# Patient Record
Sex: Female | Born: 1980 | Race: Black or African American | Hispanic: No | Marital: Single
Health system: Southern US, Community
[De-identification: ages and names within clinical notes are randomized; demographics above are authoritative.]

## PROBLEM LIST (undated history)

## (undated) DIAGNOSIS — N926 Irregular menstruation, unspecified: Secondary | ICD-10-CM

## (undated) DIAGNOSIS — E559 Vitamin D deficiency, unspecified: Secondary | ICD-10-CM

## (undated) DIAGNOSIS — Z9851 Tubal ligation status: Secondary | ICD-10-CM

## (undated) DIAGNOSIS — M549 Dorsalgia, unspecified: Secondary | ICD-10-CM

## (undated) DIAGNOSIS — E781 Pure hyperglyceridemia: Secondary | ICD-10-CM

## (undated) DIAGNOSIS — Z8742 Personal history of other diseases of the female genital tract: Secondary | ICD-10-CM

## (undated) DIAGNOSIS — R109 Unspecified abdominal pain: Secondary | ICD-10-CM

## (undated) DIAGNOSIS — R10A2 Flank pain, left side: Secondary | ICD-10-CM

## (undated) DIAGNOSIS — I10 Essential (primary) hypertension: Secondary | ICD-10-CM

## (undated) HISTORY — DX: Irregular menstruation, unspecified: N92.6

## (undated) HISTORY — DX: Essential (primary) hypertension: I10

## (undated) HISTORY — DX: Dorsalgia, unspecified: M54.9

## (undated) HISTORY — DX: Vitamin D deficiency, unspecified: E55.9

## (undated) HISTORY — DX: Personal history of other diseases of the female genital tract: Z87.42

## (undated) HISTORY — DX: Unspecified abdominal pain: R10.9

## (undated) HISTORY — DX: Flank pain, left side: R10.A2

## (undated) HISTORY — DX: Tubal ligation status: Z98.51

## (undated) HISTORY — DX: Pure hyperglyceridemia: E78.1

---

## 2003-03-17 ENCOUNTER — Encounter: Payer: Self-pay | Admitting: Obstetrics and Gynecology

## 2003-03-17 ENCOUNTER — Ambulatory Visit (HOSPITAL_COMMUNITY): Admission: RE | Admit: 2003-03-17 | Discharge: 2003-03-17 | Payer: Self-pay | Admitting: Obstetrics and Gynecology

## 2003-03-19 ENCOUNTER — Inpatient Hospital Stay (HOSPITAL_COMMUNITY): Admission: AD | Admit: 2003-03-19 | Discharge: 2003-03-28 | Payer: Self-pay | Admitting: Family Medicine

## 2003-03-20 ENCOUNTER — Encounter: Payer: Self-pay | Admitting: Family Medicine

## 2004-06-21 ENCOUNTER — Ambulatory Visit (HOSPITAL_COMMUNITY): Admission: RE | Admit: 2004-06-21 | Discharge: 2004-06-21 | Payer: Self-pay | Admitting: *Deleted

## 2004-07-06 ENCOUNTER — Ambulatory Visit (HOSPITAL_COMMUNITY): Admission: RE | Admit: 2004-07-06 | Discharge: 2004-07-06 | Payer: Self-pay | Admitting: *Deleted

## 2004-11-15 ENCOUNTER — Ambulatory Visit (HOSPITAL_COMMUNITY): Admission: RE | Admit: 2004-11-15 | Discharge: 2004-11-15 | Payer: Self-pay | Admitting: *Deleted

## 2004-11-25 ENCOUNTER — Inpatient Hospital Stay (HOSPITAL_COMMUNITY): Admission: AD | Admit: 2004-11-25 | Discharge: 2004-11-25 | Payer: Self-pay | Admitting: *Deleted

## 2004-11-25 ENCOUNTER — Ambulatory Visit: Payer: Self-pay | Admitting: *Deleted

## 2004-11-28 ENCOUNTER — Inpatient Hospital Stay (HOSPITAL_COMMUNITY): Admission: AD | Admit: 2004-11-28 | Discharge: 2004-11-28 | Payer: Self-pay | Admitting: Family Medicine

## 2004-11-28 ENCOUNTER — Ambulatory Visit: Payer: Self-pay | Admitting: Family Medicine

## 2004-12-02 ENCOUNTER — Ambulatory Visit: Payer: Self-pay | Admitting: Obstetrics & Gynecology

## 2004-12-06 ENCOUNTER — Ambulatory Visit: Payer: Self-pay | Admitting: Obstetrics and Gynecology

## 2004-12-06 ENCOUNTER — Ambulatory Visit: Payer: Self-pay | Admitting: Family Medicine

## 2004-12-06 ENCOUNTER — Inpatient Hospital Stay (HOSPITAL_COMMUNITY): Admission: AD | Admit: 2004-12-06 | Discharge: 2004-12-10 | Payer: Self-pay | Admitting: *Deleted

## 2006-07-11 ENCOUNTER — Inpatient Hospital Stay (HOSPITAL_COMMUNITY): Admission: AD | Admit: 2006-07-11 | Discharge: 2006-07-11 | Payer: Self-pay | Admitting: Gynecology

## 2006-08-29 ENCOUNTER — Other Ambulatory Visit: Admission: RE | Admit: 2006-08-29 | Discharge: 2006-08-29 | Payer: Self-pay | Admitting: Obstetrics and Gynecology

## 2019-04-08 ENCOUNTER — Encounter: Payer: Self-pay | Admitting: Family Medicine

## 2019-04-08 ENCOUNTER — Other Ambulatory Visit: Payer: Self-pay

## 2019-04-08 ENCOUNTER — Ambulatory Visit (INDEPENDENT_AMBULATORY_CARE_PROVIDER_SITE_OTHER): Payer: Self-pay | Admitting: Family Medicine

## 2019-04-08 VITALS — BP 136/86 | HR 78 | Temp 97.5°F | Ht 59.0 in | Wt 143.6 lb

## 2019-04-08 DIAGNOSIS — N926 Irregular menstruation, unspecified: Secondary | ICD-10-CM

## 2019-04-08 DIAGNOSIS — Z0000 Encounter for general adult medical examination without abnormal findings: Secondary | ICD-10-CM

## 2019-04-08 DIAGNOSIS — I160 Hypertensive urgency: Secondary | ICD-10-CM

## 2019-04-08 DIAGNOSIS — R0602 Shortness of breath: Secondary | ICD-10-CM

## 2019-04-08 DIAGNOSIS — Z09 Encounter for follow-up examination after completed treatment for conditions other than malignant neoplasm: Secondary | ICD-10-CM

## 2019-04-08 DIAGNOSIS — I10 Essential (primary) hypertension: Secondary | ICD-10-CM | POA: Insufficient documentation

## 2019-04-08 DIAGNOSIS — R10A2 Flank pain, left side: Secondary | ICD-10-CM

## 2019-04-08 DIAGNOSIS — R0789 Other chest pain: Secondary | ICD-10-CM

## 2019-04-08 DIAGNOSIS — R8290 Unspecified abnormal findings in urine: Secondary | ICD-10-CM

## 2019-04-08 DIAGNOSIS — Z7689 Persons encountering health services in other specified circumstances: Secondary | ICD-10-CM

## 2019-04-08 DIAGNOSIS — R109 Unspecified abdominal pain: Secondary | ICD-10-CM

## 2019-04-08 DIAGNOSIS — Z131 Encounter for screening for diabetes mellitus: Secondary | ICD-10-CM

## 2019-04-08 LAB — POCT URINALYSIS DIP (MANUAL ENTRY)
Bilirubin, UA: NEGATIVE
Glucose, UA: NEGATIVE mg/dL
Ketones, POC UA: NEGATIVE mg/dL
Leukocytes, UA: NEGATIVE
Nitrite, UA: NEGATIVE
Protein Ur, POC: NEGATIVE mg/dL
Spec Grav, UA: 1.01 (ref 1.010–1.025)
Urobilinogen, UA: 0.2 E.U./dL
pH, UA: 7 (ref 5.0–8.0)

## 2019-04-08 LAB — POCT GLYCOSYLATED HEMOGLOBIN (HGB A1C): Hemoglobin A1C: 5.3 % (ref 4.0–5.6)

## 2019-04-08 MED ORDER — HYDROCHLOROTHIAZIDE 12.5 MG PO CAPS: 12.5000 mg | ORAL_CAPSULE | Freq: Every day | ORAL | 1 refills | Status: DC

## 2019-04-08 MED ORDER — CLONIDINE HCL 0.1 MG PO TABS
0.2000 mg | ORAL_TABLET | Freq: Once | ORAL | Status: AC
  Administered 2019-04-08: 0.2 mg via ORAL

## 2019-04-08 MED ORDER — CLONIDINE HCL 0.1 MG PO TABS
0.1000 mg | ORAL_TABLET | Freq: Once | ORAL | Status: AC
  Administered 2019-04-08: 0.1 mg via ORAL

## 2019-04-08 MED ORDER — IBUPROFEN 800 MG PO TABS
800.0000 mg | ORAL_TABLET | Freq: Three times a day (TID) | ORAL | 2 refills | Status: AC | PRN
Start: 2019-04-08 — End: ?

## 2019-04-08 MED ORDER — AMLODIPINE BESYLATE 5 MG PO TABS: 5.0000 mg | ORAL_TABLET | Freq: Every day | ORAL | 1 refills | Status: AC

## 2019-04-08 NOTE — Progress Notes (Signed)
Patient [facility]   New Patient--Establish Care  Subjective:  Patient ID: Ellen Reed, female    DOB: Jul 08, 1981  Age: 38 y.o. MRN: [id number]  CC:  Chief Complaint  Patient presents with  . Establish Care  . Menstrual Problem  . Back Pain  . Hypertension   HPI Ellen Reed is a 38 year old female who presents to [facility] today.   Past Medical History:  Diagnosis Date  . Hypertension    Current Status: Patient states that she was taking HCTZ, but has been lost to follow up and states that she had not taken any medications for about 3 years now. She admits to occasional chest discomfort and shortness of breath lately. She denies visual changes, chest pain, cough, heart palpitations, and falls. She has occasional headaches and dizziness with position changes. Denies severe headaches, confusion, seizures, double vision, and blurred vision, nausea and vomiting. She has been having abnormal periods since 02/13/2019, with only spotting. She has been having left flank pain X 2 weeks. She has not had any injuries. She is taking Acetaminophen with minimal relief.   She denies fevers, chills, fatigue, recent infections, weight loss, and night sweats. No reports of GI problems such as diarrhea, and constipation. She has no reports of blood in stools, dysuria and hematuria. No depression or anxiety reported today.  History reviewed. No pertinent surgical history.  Family History  Problem Relation Age of Onset  . Healthy Mother   . Healthy Father   . Hypertension Brother   . Hypertension Brother     Social History   Socioeconomic History  . Marital status: Married    Spouse name: Not on file  . Number of children: Not on file  . Years of education: Not on file  . Highest education level: Not on file  Occupational History  . Not on file  Social Needs  . Financial resource strain: Not on file  . Food insecurity:    Worry:  Not on file    Inability: Not on file  . Transportation needs:    Medical: Not on file    Non-medical: Not on file  Tobacco Use  . Smoking status: Never Smoker  . Smokeless tobacco: Never Used  Substance and Sexual Activity  . Alcohol use: Yes  . Drug use: Never  . Sexual activity: Yes    Partners: Male    Birth control/protection: None  Lifestyle  . Physical activity:    Days per week: Not on file    Minutes per session: Not on file  . Stress: Not on file  Relationships  . Social connections:    Talks on phone: Not on file    Gets together: Not on file    Attends religious service: Not on file    Active member of club or organization: Not on file    Attends meetings of clubs or organizations: Not on file    Relationship status: Not on file  . Intimate partner violence:    Fear of current or ex partner: Not on file    Emotionally abused: Not on file    Physically abused: Not on file    Forced sexual activity: Not on file  Other Topics Concern  . Not on file  Social History Narrative  . Not on file    No outpatient medications prior to visit.   No facility-administered medications prior to visit.     No Known Allergies  ROS Review of Systems  Constitutional: Negative.   HENT: Negative.   Eyes: Negative.   Respiratory: Positive for shortness of breath.   Cardiovascular: Negative.        Chest discomfort  Gastrointestinal: Negative.   Endocrine: Negative.   Genitourinary: Positive for flank pain (left) and menstrual problem (irregular period).  Musculoskeletal: Positive for back pain (left back/flank pain).  Skin: Negative.   Allergic/Immunologic: Negative.   Neurological: Positive for dizziness and headaches.  Hematological: Negative.   Psychiatric/Behavioral: Negative.    Objective:    Physical Exam  Constitutional: She is oriented to person, place, and time. She appears well-developed and well-nourished.  HENT:  Head: Normocephalic and atraumatic.   Eyes: Pupils are equal, round, and reactive to light. Conjunctivae and EOM are normal.  Neck: Normal range of motion. Neck supple.  Cardiovascular: Normal rate, regular rhythm, normal heart sounds and intact distal pulses.  Pulmonary/Chest: Effort normal and breath sounds normal.  Abdominal: Soft. Bowel sounds are normal.  Musculoskeletal: Normal range of motion.  Neurological: She is alert and oriented to person, place, and time. She has normal reflexes.  Skin: Skin is warm and dry.  Psychiatric: She has a normal mood and affect. Her behavior is normal. Judgment and thought content normal.  Nursing note and vitals reviewed.   BP 136/86   Pulse 78   Temp (!) 97.5 F (36.4 C) (Oral)   Ht 4' 11"  (1.499 m)   Wt 143 lb 9.6 oz (65.1 kg)   LMP 02/08/2019   SpO2 99%   BMI 29.00 kg/m  Wt Readings from Last 3 Encounters:  04/08/19 143 lb 9.6 oz (65.1 kg)    Health Maintenance Due  Topic Date Due  . HIV Screening  06/10/1998  . TETANUS/TDAP  06/10/2002  . PAP SMEAR-Modifier  06/10/2004    There are no preventive care reminders to display for this patient.  No results found for: TSH No results found for: WBC, HGB, HCT, MCV, PLT No results found for: NA, K, CHLORIDE, CO2, GLUCOSE, BUN, CREATININE, BILITOT, ALKPHOS, AST, ALT, PROT, ALBUMIN, CALCIUM, ANIONGAP, EGFR, GFR No results found for: CHOL No results found for: HDL No results found for: LDLCALC No results found for: TRIG No results found for: CHOLHDL Lab Results  Component Value Date   HGBA1C 5.3 04/08/2019   Assessment & Plan:   1. Encounter to establish care  2. Hypertensive urgency Blood pressures are elevated today. Clonidine 0.3 mg given to patient in office and blood pressures eventually stabalize. She will report to ED if he eperiences severe headaches, confusion, seizures, double vision, and blurred vision, nausea and vomiting.e denies severe headaches, confusion, seizures, double vision, and blurred vision,  nausea and vomiting. Patient verbalized understanding.   - cloNIDine (CATAPRES) tablet 0.1 mg - cloNIDine (CATAPRES) tablet 0.2 mg  3. Hypertension, unspecified type We will initiate HCTZ and Amlodipine today.  Blood pressure stabalized at 136/86 today. She will continue to decrease high sodium intake, excessive alcohol intake, increase potassium intake, smoking cessation, and increase physical activity of at least 30 minutes of cardio activity daily. She will continue to follow Heart Healthy or DASH diet. - cloNIDine (CATAPRES) tablet 0.1 mg - cloNIDine (CATAPRES) tablet 0.2 mg - amLODipine (NORVASC) 5 MG tablet; Take 1 tablet (5 mg total) by mouth daily.  Dispense: 30 tablet; Refill: 1  4. Chest discomfort ECG results are normal.   5. Shortness of breath Stable.  6. Abnormal menstrual periods We will re-evaluate at next office visit.  7. Acute left flank pain We will initiate Motrin today.  - ibuprofen (ADVIL) 800 MG tablet; Take 1 tablet (800 mg total) by mouth every 8 (eight) hours as needed.  Dispense: 30 tablet; Refill: 2  8. Screening for diabetes mellitus Hgb A1c is within normal range of 5.3 today.  She will continue to decrease foods/beverages high in sugars and carbs and follow Heart Healthy or DASH diet. Increase physical activity to at least 30 minutes cardio exercise daily.  - POCT glycosylated hemoglobin (Hb A1C) - POCT urinalysis dipstick  9. Healthcare maintenance - CBC with Differential - Comprehensive metabolic panel - Lipid Panel - TSH - Vitamin D, 25-hydroxy - Vitamin B12  10. Abnormal urinalysis Results are pending.  - Urine Culture  11. Follow up He will follow up in 1 month.   Meds ordered this encounter  Medications  . cloNIDine (CATAPRES) tablet 0.1 mg  . cloNIDine (CATAPRES) tablet 0.2 mg  . amLODipine (NORVASC) 5 MG tablet    Sig: Take 1 tablet (5 mg total) by mouth daily.    Dispense:  30 tablet    Refill:  1  . hydrochlorothiazide  (MICROZIDE) 12.5 MG capsule    Sig: Take 1 capsule (12.5 mg total) by mouth daily.    Dispense:  30 capsule    Refill:  1  . ibuprofen (ADVIL) 800 MG tablet    Sig: Take 1 tablet (800 mg total) by mouth every 8 (eight) hours as needed.    Dispense:  30 tablet    Refill:  2    Orders Placed This Encounter  Procedures  . Urine Culture  . CBC with Differential  . Comprehensive metabolic panel  . Lipid Panel  . TSH  . Vitamin D, 25-hydroxy  . Vitamin B12  . POCT glycosylated hemoglobin (Hb A1C)  . POCT urinalysis dipstick    Referral Orders  No referral(s) requested today    Kathe Becton,  MSN, FNP-C Patient [address] [address], [place] [id number] [phone]   Problem List Items Addressed This Visit    None    Visit Diagnoses    Encounter to establish care    -  Primary   Hypertensive urgency       Relevant Medications   cloNIDine (CATAPRES) tablet 0.1 mg (Completed)   cloNIDine (CATAPRES) tablet 0.2 mg (Completed)   amLODipine (NORVASC) 5 MG tablet   hydrochlorothiazide (MICROZIDE) 12.5 MG capsule   Hypertension, unspecified type       Relevant Medications   cloNIDine (CATAPRES) tablet 0.1 mg (Completed)   cloNIDine (CATAPRES) tablet 0.2 mg (Completed)   amLODipine (NORVASC) 5 MG tablet   hydrochlorothiazide (MICROZIDE) 12.5 MG capsule   Chest discomfort       Shortness of breath       Abnormal menstrual periods       Acute left flank pain       Relevant Medications   ibuprofen (ADVIL) 800 MG tablet   Screening for diabetes mellitus       Relevant Orders   POCT glycosylated hemoglobin (Hb A1C) (Completed)   POCT urinalysis dipstick (Completed)   Healthcare maintenance       Relevant Orders   CBC with Differential   Comprehensive metabolic panel   Lipid Panel   TSH   Vitamin D, 25-hydroxy   Vitamin B12   Abnormal urinalysis       Relevant Orders   Urine Culture   Follow up  Meds ordered this  encounter  Medications  . cloNIDine (CATAPRES) tablet 0.1 mg  . cloNIDine (CATAPRES) tablet 0.2 mg  . amLODipine (NORVASC) 5 MG tablet    Sig: Take 1 tablet (5 mg total) by mouth daily.    Dispense:  30 tablet    Refill:  1  . hydrochlorothiazide (MICROZIDE) 12.5 MG capsule    Sig: Take 1 capsule (12.5 mg total) by mouth daily.    Dispense:  30 capsule    Refill:  1  . ibuprofen (ADVIL) 800 MG tablet    Sig: Take 1 tablet (800 mg total) by mouth every 8 (eight) hours as needed.    Dispense:  30 tablet    Refill:  2    Follow-up: Return in about 1 month (around 05/09/2019).    Azzie Glatter, FNP

## 2019-04-08 NOTE — Patient Instructions (Addendum)
Amlodipine tablets What is this medicine? AMLODIPINE (am LOE di peen) is a calcium-channel blocker. It affects the amount of calcium found in your heart and muscle cells. This relaxes your blood vessels, which can reduce the amount of work the heart has to do. This medicine is used to lower high blood pressure. It is also used to prevent chest pain. This medicine may be used for other purposes; ask your health care provider or pharmacist if you have questions. COMMON BRAND NAME(S): Norvasc What should I tell my health care provider before I take this medicine? They need to know if you have any of these conditions: -heart disease -liver disease -an unusual or allergic reaction to amlodipine, other medicines, foods, dyes, or preservatives -pregnant or trying to get pregnant -breast-feeding How should I use this medicine? Take this medicine by mouth with a glass of water. Follow the directions on the prescription label. You can take it with or without food. If it upsets your stomach, take it with food. Take your medicine at regular intervals. Do not take it more often than directed. Do not stop taking except on your doctor's advice. Talk to your pediatrician regarding the use of this medicine in children. While this drug may be prescribed for children as young as 6 years for selected conditions, precautions do apply. Patients over 80 years of age may have a stronger reaction and need a smaller dose. Overdosage: If you think you have taken too much of this medicine contact a poison control center or emergency room at once. NOTE: This medicine is only for you. Do not share this medicine with others. What if I miss a dose? If you miss a dose, take it as soon as you can. If it is almost time for your next dose, take only that dose. Do not take double or extra doses. What may interact with this medicine? Do not take this medicine with any of the following medications: -tranylcypromine This medicine  may also interact with the following medications: -clarithromycin -cyclosporine -diltiazem -itraconazole -simvastatin -tacrolimus This list may not describe all possible interactions. Give your health care provider a list of all the medicines, herbs, non-prescription drugs, or dietary supplements you use. Also tell them if you smoke, drink alcohol, or use illegal drugs. Some items may interact with your medicine. What should I watch for while using this medicine? Visit your healthcare professional for regular checks on your progress. Check your blood pressure as directed. Ask your healthcare professional what your blood pressure should be and when you should contact him or her. Do not treat yourself for coughs, colds, or pain while you are using this medicine without asking your healthcare professional for advice. Some medicines may increase your blood pressure. You may get dizzy. Do not drive, use machinery, or do anything that needs mental alertness until you know how this medicine affects you. Do not stand or sit up quickly, especially if you are an older patient. This reduces the risk of dizzy or fainting spells. Avoid alcoholic drinks; they can make you dizzier. What side effects may I notice from receiving this medicine? Side effects that you should report to your doctor or health care professional as soon as possible: -allergic reactions like skin rash, itching or hives; swelling of the face, lips, or tongue -fast, irregular heartbeat -signs and symptoms of low blood pressure like dizziness; feeling faint or lightheaded, falls; unusually weak or tired -swelling of ankles, feet, hands Side effects that usually do not require medical  attention (report these to your doctor or health care professional if they continue or are bothersome): -dry mouth -facial flushing -headache -stomach pain -tiredness This list may not describe all possible side effects. Call your doctor for medical advice  about side effects. You may report side effects to FDA at 1-800-FDA-1088. Where should I keep my medicine? Keep out of the reach of children. Store at room temperature between 59 and 86 degrees F (15 and 30 degrees C). Throw away any unused medicine after the expiration date. NOTE: This sheet is a summary. It may not cover all possible information. If you have questions about this medicine, talk to your doctor, pharmacist, or health care provider.  2019 Elsevier/Gold Standard (2018-06-14 15:07:10) Hydrochlorothiazide, HCTZ capsules or tablets What is this medicine? HYDROCHLOROTHIAZIDE (hye droe klor oh THYE a zide) is a diuretic. It increases the amount of urine passed, which causes the body to lose salt and water. This medicine is used to treat high blood pressure. It is also reduces the swelling and water retention caused by various medical conditions, such as heart, liver, or kidney disease. This medicine may be used for other purposes; ask your health care provider or pharmacist if you have questions. COMMON BRAND NAME(S): Esidrix, Ezide, HydroDIURIL, Microzide, Oretic, Zide What should I tell my health care provider before I take this medicine? They need to know if you have any of these conditions: -diabetes -gout -immune system problems, like lupus -kidney disease or kidney stones -liver disease -pancreatitis -small amount of urine or difficulty passing urine -an unusual or allergic reaction to hydrochlorothiazide, sulfa drugs, other medicines, foods, dyes, or preservatives -pregnant or trying to get pregnant -breast-feeding How should I use this medicine? Take this medicine by mouth with a glass of water. Follow the directions on the prescription label. Take your medicine at regular intervals. Remember that you will need to pass urine frequently after taking this medicine. Do not take your doses at a time of day that will cause you problems. Do not stop taking your medicine unless  your doctor tells you to. Talk to your pediatrician regarding the use of this medicine in children. Special care may be needed. Overdosage: If you think you have taken too much of this medicine contact a poison control center or emergency room at once. NOTE: This medicine is only for you. Do not share this medicine with others. What if I miss a dose? If you miss a dose, take it as soon as you can. If it is almost time for your next dose, take only that dose. Do not take double or extra doses. What may interact with this medicine? -cholestyramine -colestipol -digoxin -dofetilide -lithium -medicines for blood pressure -medicines for diabetes -medicines that relax muscles for surgery -other diuretics -steroid medicines like prednisone or cortisone This list may not describe all possible interactions. Give your health care provider a list of all the medicines, herbs, non-prescription drugs, or dietary supplements you use. Also tell them if you smoke, drink alcohol, or use illegal drugs. Some items may interact with your medicine. What should I watch for while using this medicine? Visit your doctor or health care professional for regular checks on your progress. Check your blood pressure as directed. Ask your doctor or health care professional what your blood pressure should be and when you should contact him or her. You may need to be on a special diet while taking this medicine. Ask your doctor. Check with your doctor or health care professional if  you get an attack of severe diarrhea, nausea and vomiting, or if you sweat a lot. The loss of too much body fluid can make it dangerous for you to take this medicine. You may get drowsy or dizzy. Do not drive, use machinery, or do anything that needs mental alertness until you know how this medicine affects you. Do not stand or sit up quickly, especially if you are an older patient. This reduces the risk of dizzy or fainting spells. Alcohol may  interfere with the effect of this medicine. Avoid alcoholic drinks. This medicine may affect your blood sugar level. If you have diabetes, check with your doctor or health care professional before changing the dose of your diabetic medicine. This medicine can make you more sensitive to the sun. Keep out of the sun. If you cannot avoid being in the sun, wear protective clothing and use sunscreen. Do not use sun lamps or tanning beds/booths. What side effects may I notice from receiving this medicine? Side effects that you should report to your doctor or health care professional as soon as possible: -allergic reactions such as skin rash or itching, hives, swelling of the lips, mouth, tongue, or throat -changes in vision -chest pain -eye pain -fast or irregular heartbeat -feeling faint or lightheaded, falls -gout attack -muscle pain or cramps -pain or difficulty when passing urine -pain, tingling, numbness in the hands or feet -redness, blistering, peeling or loosening of the skin, including inside the mouth -unusually weak or tired Side effects that usually do not require medical attention (report to your doctor or health care professional if they continue or are bothersome): -change in sex drive or performance -dry mouth -headache -stomach upset This list may not describe all possible side effects. Call your doctor for medical advice about side effects. You may report side effects to FDA at 1-800-FDA-1088. Where should I keep my medicine? Keep out of the reach of children. Store at room temperature between 15 and 30 degrees C (59 and 86 degrees F). Do not freeze. Protect from light and moisture. Keep container closed tightly. Throw away any unused medicine after the expiration date. NOTE: This sheet is a summary. It may not cover all possible information. If you have questions about this medicine, talk to your doctor, pharmacist, or health care provider.  2019 Elsevier/Gold Standard  (2010-07-15 12:57:37) Hypertension Hypertension, commonly called high blood pressure, is when the force of blood pumping through the arteries is too strong. The arteries are the blood vessels that carry blood from the heart throughout the body. Hypertension forces the heart to work harder to pump blood and may cause arteries to become narrow or stiff. Having untreated or uncontrolled hypertension can cause heart attacks, strokes, kidney disease, and other problems. A blood pressure reading consists of a higher number over a lower number. Ideally, your blood pressure should be below 120/80. The first ("top") number is called the systolic pressure. It is a measure of the pressure in your arteries as your heart beats. The second ("bottom") number is called the diastolic pressure. It is a measure of the pressure in your arteries as the heart relaxes. What are the causes? The cause of this condition is not known. What increases the risk? Some risk factors for high blood pressure are under your control. Others are not. Factors you can change  Smoking.  Having type 2 diabetes mellitus, high cholesterol, or both.  Not getting enough exercise or physical activity.  Being overweight.  Having too much fat,  sugar, calories, or salt (sodium) in your diet.  Drinking too much alcohol. Factors that are difficult or impossible to change  Having chronic kidney disease.  Having a family history of high blood pressure.  Age. Risk increases with age.  Race. You may be at higher risk if you are African-American.  Gender. Men are at higher risk than women before age 10. After age 48, women are at higher risk than men.  Having obstructive sleep apnea.  Stress. What are the signs or symptoms? Extremely high blood pressure (hypertensive crisis) may cause:  Headache.  Anxiety.  Shortness of breath.  Nosebleed.  Nausea and vomiting.  Severe chest pain.  Jerky movements you cannot control  (seizures). How is this diagnosed? This condition is diagnosed by measuring your blood pressure while you are seated, with your arm resting on a surface. The cuff of the blood pressure monitor will be placed directly against the skin of your upper arm at the level of your heart. It should be measured at least twice using the same arm. Certain conditions can cause a difference in blood pressure between your right and left arms. Certain factors can cause blood pressure readings to be lower or higher than normal (elevated) for a short period of time:  When your blood pressure is higher when you are in a health care provider's office than when you are at home, this is called white coat hypertension. Most people with this condition do not need medicines.  When your blood pressure is higher at home than when you are in a health care provider's office, this is called masked hypertension. Most people with this condition may need medicines to control blood pressure. If you have a high blood pressure reading during one visit or you have normal blood pressure with other risk factors:  You may be asked to return on a different day to have your blood pressure checked again.  You may be asked to monitor your blood pressure at home for 1 week or longer. If you are diagnosed with hypertension, you may have other blood or imaging tests to help your health care provider understand your overall risk for other conditions. How is this treated? This condition is treated by making healthy lifestyle changes, such as eating healthy foods, exercising more, and reducing your alcohol intake. Your health care provider may prescribe medicine if lifestyle changes are not enough to get your blood pressure under control, and if:  Your systolic blood pressure is above 130.  Your diastolic blood pressure is above 80. Your personal target blood pressure may vary depending on your medical conditions, your age, and other  factors. Follow these instructions at home: Eating and drinking   Eat a diet that is high in fiber and potassium, and low in sodium, added sugar, and fat. An example eating plan is called the DASH (Dietary Approaches to Stop Hypertension) diet. To eat this way: ? Eat plenty of fresh fruits and vegetables. Try to fill half of your plate at each meal with fruits and vegetables. ? Eat whole grains, such as whole wheat pasta, brown rice, or whole grain bread. Fill about one quarter of your plate with whole grains. ? Eat or drink low-fat dairy products, such as skim milk or low-fat yogurt. ? Avoid fatty cuts of meat, processed or cured meats, and poultry with skin. Fill about one quarter of your plate with lean proteins, such as fish, chicken without skin, beans, eggs, and tofu. ? Avoid premade and processed  foods. These tend to be higher in sodium, added sugar, and fat.  Reduce your daily sodium intake. Most people with hypertension should eat less than 1,500 mg of sodium a day.  Limit alcohol intake to no more than 1 drink a day for nonpregnant women and 2 drinks a day for men. One drink equals 12 oz of beer, 5 oz of wine, or 1 oz of hard liquor. Lifestyle   Work with your health care provider to maintain a healthy body weight or to lose weight. Ask what an ideal weight is for you.  Get at least 30 minutes of exercise that causes your heart to beat faster (aerobic exercise) most days of the week. Activities may include walking, swimming, or biking.  Include exercise to strengthen your muscles (resistance exercise), such as pilates or lifting weights, as part of your weekly exercise routine. Try to do these types of exercises for 30 minutes at least 3 days a week.  Do not use any products that contain nicotine or tobacco, such as cigarettes and e-cigarettes. If you need help quitting, ask your health care provider.  Monitor your blood pressure at home as told by your health care  provider.  Keep all follow-up visits as told by your health care provider. This is important. Medicines  Take over-the-counter and prescription medicines only as told by your health care provider. Follow directions carefully. Blood pressure medicines must be taken as prescribed.  Do not skip doses of blood pressure medicine. Doing this puts you at risk for problems and can make the medicine less effective.  Ask your health care provider about side effects or reactions to medicines that you should watch for. Contact a health care provider if:  You think you are having a reaction to a medicine you are taking.  You have headaches that keep coming back (recurring).  You feel dizzy.  You have swelling in your ankles.  You have trouble with your vision. Get help right away if:  You develop a severe headache or confusion.  You have unusual weakness or numbness.  You feel faint.  You have severe pain in your chest or abdomen.  You vomit repeatedly.  You have trouble breathing. Summary  Hypertension is when the force of blood pumping through your arteries is too strong. If this condition is not controlled, it may put you at risk for serious complications.  Your personal target blood pressure may vary depending on your medical conditions, your age, and other factors. For most people, a normal blood pressure is less than 120/80.  Hypertension is treated with lifestyle changes, medicines, or a combination of both. Lifestyle changes include weight loss, eating a healthy, low-sodium diet, exercising more, and limiting alcohol. This information is not intended to replace advice given to you by your health care provider. Make sure you discuss any questions you have with your health care provider. Document Released: 11/20/2005 Document Revised: 10/18/2016 Document Reviewed: 10/18/2016 Elsevier Interactive Patient Education  2019 [employer].

## 2019-04-09 ENCOUNTER — Encounter: Payer: Self-pay | Admitting: Family Medicine

## 2019-04-09 ENCOUNTER — Other Ambulatory Visit: Payer: Self-pay | Admitting: Family Medicine

## 2019-04-09 DIAGNOSIS — E559 Vitamin D deficiency, unspecified: Secondary | ICD-10-CM

## 2019-04-09 DIAGNOSIS — E781 Pure hyperglyceridemia: Secondary | ICD-10-CM

## 2019-04-09 LAB — VITAMIN D 25 HYDROXY (VIT D DEFICIENCY, FRACTURES): Vit D, 25-Hydroxy: 14.1 ng/mL — ABNORMAL LOW (ref 30.0–100.0)

## 2019-04-09 LAB — LIPID PANEL
Chol/HDL Ratio: 3.6 ratio (ref 0.0–4.4)
Cholesterol, Total: 191 mg/dL (ref 100–199)
HDL: 53 mg/dL (ref >39)
LDL Calculated: 67 mg/dL (ref 0–99)
Triglycerides: 356 mg/dL — ABNORMAL HIGH (ref 0–149)
VLDL Cholesterol Cal: 71 mg/dL — ABNORMAL HIGH (ref 5–40)

## 2019-04-09 LAB — CBC WITH DIFFERENTIAL/PLATELET
Basophils Absolute: 0 10*3/uL (ref 0.0–0.2)
Basos: 0 %
EOS (ABSOLUTE): 0.1 10*3/uL (ref 0.0–0.4)
Eos: 2 %
Hematocrit: 37.4 % (ref 34.0–46.6)
Hemoglobin: 13.3 g/dL (ref 11.1–15.9)
Immature Grans (Abs): 0 10*3/uL (ref 0.0–0.1)
Immature Granulocytes: 0 %
Lymphocytes Absolute: 1.7 10*3/uL (ref 0.7–3.1)
Lymphs: 20 %
MCH: 32.2 pg (ref 26.6–33.0)
MCHC: 35.6 g/dL (ref 31.5–35.7)
MCV: 91 fL (ref 79–97)
Monocytes Absolute: 0.4 10*3/uL (ref 0.1–0.9)
Monocytes: 5 %
Neutrophils Absolute: 6 10*3/uL (ref 1.4–7.0)
Neutrophils: 73 %
Platelets: 254 10*3/uL (ref 150–450)
RBC: 4.13 x10E6/uL (ref 3.77–5.28)
RDW: 14 % (ref 11.7–15.4)
WBC: 8.3 10*3/uL (ref 3.4–10.8)

## 2019-04-09 LAB — COMPREHENSIVE METABOLIC PANEL
ALT: 22 IU/L (ref 0–32)
AST: 17 IU/L (ref 0–40)
Albumin/Globulin Ratio: 1.7 (ref 1.2–2.2)
Albumin: 4.3 g/dL (ref 3.8–4.8)
Alkaline Phosphatase: 81 IU/L (ref 39–117)
BUN/Creatinine Ratio: 13 (ref 9–23)
BUN: 7 mg/dL (ref 6–20)
Bilirubin Total: <0.2 mg/dL (ref 0.0–1.2)
CO2: 20 mmol/L (ref 20–29)
Calcium: 9.4 mg/dL (ref 8.7–10.2)
Chloride: 102 mmol/L (ref 96–106)
Creatinine, Ser: 0.52 mg/dL — ABNORMAL LOW (ref 0.57–1.00)
GFR calc Af Amer: 143 mL/min/{1.73_m2} (ref >59)
GFR calc non Af Amer: 124 mL/min/{1.73_m2} (ref >59)
Globulin, Total: 2.6 g/dL (ref 1.5–4.5)
Glucose: 91 mg/dL (ref 65–99)
Potassium: 4 mmol/L (ref 3.5–5.2)
Sodium: 137 mmol/L (ref 134–144)
Total Protein: 6.9 g/dL (ref 6.0–8.5)

## 2019-04-09 LAB — VITAMIN B12: Vitamin B-12: 434 pg/mL (ref 232–1245)

## 2019-04-09 LAB — TSH: TSH: 2.44 u[IU]/mL (ref 0.450–4.500)

## 2019-04-09 MED ORDER — ATORVASTATIN CALCIUM 10 MG PO TABS: 10.0000 mg | ORAL_TABLET | Freq: Every day | ORAL | 3 refills | Status: AC

## 2019-04-09 MED ORDER — VITAMIN D (ERGOCALCIFEROL) 1.25 MG (50000 UNIT) PO CAPS: 50000.0000 [IU] | ORAL_CAPSULE | ORAL | 3 refills | Status: AC

## 2019-04-10 LAB — URINE CULTURE

## 2019-04-17 ENCOUNTER — Other Ambulatory Visit: Payer: Self-pay | Admitting: Family Medicine

## 2019-04-17 DIAGNOSIS — A498 Other bacterial infections of unspecified site: Secondary | ICD-10-CM

## 2019-04-17 DIAGNOSIS — B952 Enterococcus as the cause of diseases classified elsewhere: Secondary | ICD-10-CM

## 2019-04-17 MED ORDER — CIPROFLOXACIN HCL 500 MG PO TABS: 500.0000 mg | ORAL_TABLET | Freq: Two times a day (BID) | ORAL | 0 refills | Status: AC

## 2019-04-23 ENCOUNTER — Telehealth: Payer: Self-pay

## 2019-04-23 NOTE — Telephone Encounter (Signed)
Patient notified

## 2019-05-08 ENCOUNTER — Telehealth: Payer: Self-pay

## 2019-05-08 NOTE — Telephone Encounter (Signed)
Left a vm for patient to callback to do screening before appointment

## 2019-05-09 ENCOUNTER — Other Ambulatory Visit: Payer: Self-pay

## 2019-05-09 ENCOUNTER — Encounter: Payer: Self-pay | Admitting: Family Medicine

## 2019-05-09 ENCOUNTER — Ambulatory Visit (INDEPENDENT_AMBULATORY_CARE_PROVIDER_SITE_OTHER): Payer: Medicaid Other | Admitting: Family Medicine

## 2019-05-09 VITALS — BP 120/78 | HR 69 | Temp 98.4°F | Resp 16 | Wt 142.0 lb

## 2019-05-09 DIAGNOSIS — Z8742 Personal history of other diseases of the female genital tract: Secondary | ICD-10-CM

## 2019-05-09 DIAGNOSIS — Z124 Encounter for screening for malignant neoplasm of cervix: Secondary | ICD-10-CM

## 2019-05-09 DIAGNOSIS — Z0000 Encounter for general adult medical examination without abnormal findings: Secondary | ICD-10-CM | POA: Diagnosis not present

## 2019-05-09 DIAGNOSIS — Z09 Encounter for follow-up examination after completed treatment for conditions other than malignant neoplasm: Secondary | ICD-10-CM

## 2019-05-09 DIAGNOSIS — Z01419 Encounter for gynecological examination (general) (routine) without abnormal findings: Secondary | ICD-10-CM

## 2019-05-09 NOTE — Progress Notes (Signed)
[facility]  Pap Smear  Subjective:  Patient ID: Ellen Reed, female    DOB: 1981-08-10  Age: 38 y.o. MRN: [id number]  CC:  Chief Complaint  Patient presents with  . Gynecologic Exam  . Menstrual Problem    spotting inbetween periods     HPI Ellen Reed is a 38 year old female who presents for Pap Smear today.   Past Medical History:  Diagnosis Date  . Abnormal menstrual periods   . Back pain   . H/O tubal ligation   . Hypertension   . Hypertriglyceridemia   . Left flank pain   . Vitamin D deficiency    Current Status: Patient also here for routine gynecological exam. She is not had a Pap smear in greater than 2 years ago.  Patient states that she has always had abnormal Pap smears and is will need a Pap every year. She has 4 children. She is sexually active. She reports occasional spotting X 3 months in between periods. She denies any other abnormal vaginal discharge, vaginal  itching, vaginal burning, or dyspareunia. Patient states that she does not perform monthly self breast exams. She has an upcoming mammogram. She typically follows a balanced diet but does not exercise routinely. Body mass index is 28.68.Tubal ligation 5 years ago. Her last menstrual period was 04/12/2019. Her periods usually last about 5 days. No discharge, discomfort, irregular bleeding, abdominal pain. She is s/p: hysterectomy X 5 years ago. She denies recent infections, fevers, chills, weight loss, and night sweats. Denies cough, heart palpitations, chest pain, and shortness of breath. Denies any GI symptoms. She denies pain today.  History reviewed. No pertinent surgical history.  Family History  Problem Relation Age of Onset  . Healthy Mother   . Healthy Father   . Hypertension Brother   . Hypertension Brother     Social History   Socioeconomic History  . Marital status: Married    Spouse name: Not on file  . Number of children: Not  on file  . Years of education: Not on file  . Highest education level: Not on file  Occupational History  . Not on file  Social Needs  . Financial resource strain: Not on file  . Food insecurity:    Worry: Not on file    Inability: Not on file  . Transportation needs:    Medical: Not on file    Non-medical: Not on file  Tobacco Use  . Smoking status: Never Smoker  . Smokeless tobacco: Never Used  Substance and Sexual Activity  . Alcohol use: Yes  . Drug use: Never  . Sexual activity: Yes    Partners: Male    Birth control/protection: None  Lifestyle  . Physical activity:    Days per week: Not on file    Minutes per session: Not on file  . Stress: Not on file  Relationships  . Social connections:    Talks on phone: Not on file    Gets together: Not on file    Attends religious service: Not on file    Active member of club or organization: Not on file    Attends meetings of clubs or organizations: Not on file    Relationship status: Not on file  . Intimate partner violence:    Fear of current or ex partner: Not on file    Emotionally abused: Not on file    Physically abused: Not on file  Forced sexual activity: Not on file  Other Topics Concern  . Not on file  Social History Narrative  . Not on file    Outpatient Medications Prior to Visit  Medication Sig Dispense Refill  . amLODipine (NORVASC) 5 MG tablet Take 1 tablet (5 mg total) by mouth daily. 30 tablet 1  . atorvastatin (LIPITOR) 10 MG tablet Take 1 tablet (10 mg total) by mouth daily. 30 tablet 3  . hydrochlorothiazide (MICROZIDE) 12.5 MG capsule Take 1 capsule (12.5 mg total) by mouth daily. 30 capsule 1  . ibuprofen (ADVIL) 800 MG tablet Take 1 tablet (800 mg total) by mouth every 8 (eight) hours as needed. 30 tablet 2  . Vitamin D, Ergocalciferol, (DRISDOL) 1.25 MG (50000 UT) CAPS capsule Take 1 capsule (50,000 Units total) by mouth every 7 (seven) days. 5 capsule 3   No facility-administered  medications prior to visit.     No Known Allergies  ROS Review of Systems  Constitutional: Negative.   HENT: Negative.   Eyes: Negative.   Respiratory: Negative.   Cardiovascular: Negative.   Gastrointestinal: Negative.   Endocrine: Negative.   Genitourinary: Positive for menstrual problem (occasional spotting between menstrual periods ).  Musculoskeletal: Negative.   Skin: Negative.   Allergic/Immunologic: Negative.   Neurological: Negative.   Hematological: Negative.   Psychiatric/Behavioral: Negative.    Objective:    Physical Exam  Constitutional: She is oriented to person, place, and time. She appears well-developed and well-nourished.  HENT:  Head: Normocephalic and atraumatic.  Eyes: Conjunctivae are normal.  Neck: Normal range of motion. Neck supple.  Cardiovascular: Normal rate, regular rhythm, normal heart sounds and intact distal pulses.  Pulmonary/Chest: Effort normal and breath sounds normal.  Abdominal: Soft. Bowel sounds are normal.  Musculoskeletal: Normal range of motion.  Neurological: She is alert and oriented to person, place, and time. She has normal reflexes.  Skin: Skin is warm and dry.  Psychiatric: She has a normal mood and affect. Her behavior is normal. Judgment and thought content normal.  Nursing note and vitals reviewed.   BP 120/78 (BP Location: Left Arm, Patient Position: Sitting, Cuff Size: Normal)   Pulse 69   Temp 98.4 F (36.9 C) (Oral)   Resp 16   Wt 142 lb (64.4 kg)   LMP 04/12/2019   SpO2 100%   BMI 28.68 kg/m  Wt Readings from Last 3 Encounters:  05/09/19 142 lb (64.4 kg)  04/08/19 143 lb 9.6 oz (65.1 kg)     Health Maintenance Due  Topic Date Due  . HIV Screening  06/10/1998  . TETANUS/TDAP  06/10/2002  . PAP SMEAR-Modifier  06/10/2004    There are no preventive care reminders to display for this patient.  Lab Results  Component Value Date   TSH 2.440 04/08/2019   Lab Results  Component Value Date   WBC  8.3 04/08/2019   HGB 13.3 04/08/2019   HCT 37.4 04/08/2019   MCV 91 04/08/2019   PLT 254 04/08/2019   Lab Results  Component Value Date   NA 137 04/08/2019   K 4.0 04/08/2019   CO2 20 04/08/2019   GLUCOSE 91 04/08/2019   BUN 7 04/08/2019   CREATININE 0.52 (L) 04/08/2019   BILITOT <0.2 04/08/2019   ALKPHOS 81 04/08/2019   AST 17 04/08/2019   ALT 22 04/08/2019   PROT 6.9 04/08/2019   ALBUMIN 4.3 04/08/2019   CALCIUM 9.4 04/08/2019   Lab Results  Component Value Date   CHOL 191 04/08/2019  Lab Results  Component Value Date   HDL 53 04/08/2019   Lab Results  Component Value Date   LDLCALC 67 04/08/2019   Lab Results  Component Value Date   TRIG 356 (H) 04/08/2019   Lab Results  Component Value Date   CHOLHDL 3.6 04/08/2019   Lab Results  Component Value Date   HGBA1C 5.3 04/08/2019    Assessment & Plan:   1. Encounter for cervical Pap smear with pelvic exam Pap test, as part of routine gynecological examination Continue monthly self breast exam Recommend daily multivitamin for women Recommend strength training in 150 minutes of cardiovascular exercise per week  2. Pap smear for cervical cancer screening - Pap IG, CT/NG w/ reflex HPV when [facility] [employer]  3. History of abnormal Pap Smear  4. Follow up She will follow up in 6 months.   No orders of the defined types were placed in this encounter.   No orders of the defined types were placed in this encounter.   Referral Orders  No referral(s) requested today    Raliegh IpNatalie Callyn Severtson,  MSN, FNP-BC [facility] [address]  [place], [place] [id number] [phone]  Problem List Items Addressed This Visit    None    Visit Diagnoses    Encounter for cervical Pap smear with pelvic exam    -  Primary   Pap smear for cervical cancer screening       Relevant Orders   Pap IG, CT/NG w/ reflex HPV when [facility]   Follow up          No orders of the  defined types were placed in this encounter.   Follow-up: Return in about 6 months (around 11/08/2019).    Kallie LocksNatalie M Shajuan Musso, FNP

## 2019-05-09 NOTE — Progress Notes (Signed)
Vitals take by Marko Stai.

## 2019-05-09 NOTE — Patient Instructions (Addendum)
Pap Test  Why am I having this test?  A Pap test, also called a Pap smear, is a screening test to check for signs of:  · Cancer of the vagina, cervix, and uterus. The cervix is the lower part of the uterus that opens into the vagina.  · Infection.  · Changes that may be a sign that cancer is developing (precancerous changes).  Women need this test on a regular basis. In general, you should have a Pap test every 3 years until you reach menopause or age 36. Women aged 30-60 may choose to have their Pap test done at the same time as an HPV (human papillomavirus) test every 5 years (instead of every 3 years).  Your health care provider may recommend having Pap tests more or less often depending on your medical conditions and past Pap test results.  What kind of sample is taken?    Your health care provider will collect a sample of cells from the surface of your cervix. This will be done using a small cotton swab, plastic spatula, or brush. This sample is often collected during a pelvic exam, when you are lying on your back on an exam table with feet in footrests (stirrups).  In some cases, fluids (secretions) from the cervix or vagina may also be collected.  How do I prepare for this test?  · Be aware of where you are in your menstrual cycle. If you are menstruating on the day of the test, you may be asked to reschedule.  · You may need to reschedule if you have a known vaginal infection on the day of the test.  · Follow instructions from your health care provider about:  ? Changing or stopping your regular medicines. Some medicines can cause abnormal test results, such as digitalis and tetracycline.  ? Avoiding douching or taking a bath the day before or the day of the test.  Tell a health care provider about:  · Any allergies you have.  · All medicines you are taking, including vitamins, herbs, eye drops, creams, and over-the-counter medicines.  · Any blood disorders you have.  · Any surgeries you have had.  · Any  medical conditions you have.  · Whether you are pregnant or may be pregnant.  How are the results reported?  Your test results will be reported as either abnormal or normal.  A false-positive result can occur. A false positive is incorrect because it means that a condition is present when it is not.  A false-negative result can occur. A false negative is incorrect because it means that a condition is not present when it is.  What do the results mean?  A normal test result means that you do not have signs of cancer of the vagina, cervix, or uterus.  An abnormal result may mean that you have:  · Cancer. A Pap test by itself is not enough to diagnose cancer. You will have more tests done in this case.  · Precancerous changes in your vagina, cervix, or uterus.  · Inflammation of the cervix.  · An STD (sexually transmitted disease).  · A fungal infection.  · A parasite infection.  Talk with your health care provider about what your results mean.  Questions to ask your health care provider  Ask your health care provider, or the department that is doing the test:  · When will my results be ready?  · How will I get my results?  · What are my   treatment options?  · What other tests do I need?  · What are my next steps?  Summary  · In general, women should have a Pap test every 3 years until they reach menopause or age 36.  · Your health care provider will collect a sample of cells from the surface of your cervix. This will be done using a small cotton swab, plastic spatula, or brush.  · In some cases, fluids (secretions) from the cervix or vagina may also be collected.  This information is not intended to replace advice given to you by your health care provider. Make sure you discuss any questions you have with your health care provider.  Document Released: 02/10/2003 Document Revised: 07/30/2017 Document Reviewed: 07/30/2017  Elsevier Interactive Patient Education © 2019 Elsevier Inc[employer]

## 2019-05-11 ENCOUNTER — Encounter: Payer: Self-pay | Admitting: Family Medicine

## 2019-05-11 DIAGNOSIS — Z8742 Personal history of other diseases of the female genital tract: Secondary | ICD-10-CM | POA: Insufficient documentation

## 2019-05-17 LAB — PAP IG, CT-NG, RFX HPV ASCU
Chlamydia, Nuc. Acid Amp: NEGATIVE
Gonococcus by Nucleic Acid Amp: NEGATIVE

## 2019-05-18 ENCOUNTER — Other Ambulatory Visit: Payer: Self-pay | Admitting: Family Medicine

## 2019-05-18 DIAGNOSIS — B379 Candidiasis, unspecified: Secondary | ICD-10-CM

## 2019-05-18 MED ORDER — FLUCONAZOLE 150 MG PO TABS: 150.0000 mg | ORAL_TABLET | Freq: Once | ORAL | 0 refills | Status: AC

## 2019-06-06 ENCOUNTER — Other Ambulatory Visit: Payer: Self-pay | Admitting: Family Medicine

## 2019-11-10 ENCOUNTER — Ambulatory Visit: Payer: Medicaid Other | Admitting: Family Medicine

## 2020-02-29 ENCOUNTER — Other Ambulatory Visit: Payer: Self-pay | Admitting: Family Medicine

## 2020-02-29 DIAGNOSIS — R109 Unspecified abdominal pain: Secondary | ICD-10-CM

## 2020-07-23 ENCOUNTER — Emergency Department (HOSPITAL_COMMUNITY): Payer: Self-pay

## 2020-07-23 ENCOUNTER — Other Ambulatory Visit: Payer: Self-pay

## 2020-07-23 ENCOUNTER — Encounter (HOSPITAL_COMMUNITY): Payer: Self-pay | Admitting: Emergency Medicine

## 2020-07-23 ENCOUNTER — Emergency Department (HOSPITAL_COMMUNITY)
Admission: EM | Admit: 2020-07-23 | Discharge: 2020-07-23 | Disposition: A | Discharge status: Home / Self Care | Payer: Self-pay | Attending: Emergency Medicine | Admitting: Emergency Medicine

## 2020-07-23 DIAGNOSIS — Z79899 Other long term (current) drug therapy: Secondary | ICD-10-CM | POA: Insufficient documentation

## 2020-07-23 DIAGNOSIS — M5432 Sciatica, left side: Secondary | ICD-10-CM

## 2020-07-23 DIAGNOSIS — M5442 Lumbago with sciatica, left side: Secondary | ICD-10-CM | POA: Insufficient documentation

## 2020-07-23 DIAGNOSIS — I10 Essential (primary) hypertension: Secondary | ICD-10-CM | POA: Insufficient documentation

## 2020-07-23 LAB — POC URINE PREG, ED: Preg Test, Ur: NEGATIVE

## 2020-07-23 MED ORDER — PREDNISONE 50 MG PO TABS: 50.0000 mg | ORAL_TABLET | Freq: Every day | ORAL | 0 refills | Status: AC

## 2020-07-23 MED ORDER — HYDROCODONE-ACETAMINOPHEN 5-325 MG PO TABS: 1.0000 | ORAL_TABLET | Freq: Four times a day (QID) | ORAL | 0 refills | Status: AC | PRN

## 2020-07-23 NOTE — ED Notes (Signed)
Pt ambulatory to RR w/out assistance and will attempt to provide urine sample.

## 2020-07-23 NOTE — Discharge Instructions (Signed)
Take the medications as prescribed for your pain.  You can also continue your ibuprofen.  Follow-up with your doctor if your symptoms persist to discuss further evaluation and treatment as we discussed.  Return to ED for difficulties with weakness, numbness
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
## 2020-07-23 NOTE — ED Provider Notes (Signed)
[facility]-EMERGENCY DEPT Provider Note   CSN: [id number] Arrival date & time: 07/23/20  1418     History Chief Complaint  Patient presents with  . Back Pain    Ellen Reed is a 39 y.o. female.  HPI   Patient presents to the ER for evaluation of back pain.  Patient states several days ago she started having pain in her lower back rating down her left leg.  Pain is sharp and goes all the way down her leg.  Patient has noticed movement of her legs increases the pain.  Patient went to see her primary care doctor and was started on ibuprofen as well as steroids.  She is scheduled for x-rays but has not had them yet.  Patient tried to call her doctor today for an appointment because her symptoms are getting worse.  The pain medications are not working.  She denies any trouble with urination.  No numbness or weakness.  Patient states she did have a fall a few years ago and is not sure if that is related to her symptoms today.  Past Medical History:  Diagnosis Date  . Abnormal menstrual periods   . Back pain   . H/O tubal ligation   . History of abnormal cervical Pap smear   . Hypertension   . Hypertriglyceridemia   . Left flank pain   . Vitamin D deficiency     Patient Active Problem List   Diagnosis Date Noted  . History of abnormal cervical Pap smear 05/11/2019  . Hypertension 04/08/2019    History reviewed. No pertinent surgical history.   OB History   No obstetric history on file.     Family History  Problem Relation Age of Onset  . Healthy Mother   . Healthy Father   . Hypertension Brother   . Hypertension Brother     Social History   Tobacco Use  . Smoking status: Never Smoker  . Smokeless tobacco: Never Used  Substance Use Topics  . Alcohol use: Yes  . Drug use: Never    Home Medications Prior to Admission medications   Medication Sig Start Date End Date Taking? Authorizing Provider  amLODipine (NORVASC) 5 MG tablet Take  1 tablet (5 mg total) by mouth daily. 04/08/19   Kallie Locks, FNP  atorvastatin (LIPITOR) 10 MG tablet Take 1 tablet (10 mg total) by mouth daily. 04/09/19   Kallie Locks, FNP  hydrochlorothiazide (MICROZIDE) 12.5 MG capsule TAKE 1 CAPSULE(12.5 MG) BY MOUTH DAILY 06/06/19   Tukov-Yual, Alroy Bailiff, NP  HYDROcodone-acetaminophen (NORCO/VICODIN) 5-325 MG tablet Take 1 tablet by mouth every 6 (six) hours as needed. 07/23/20   Linwood Dibbles, MD  ibuprofen (ADVIL) 800 MG tablet Take 1 tablet (800 mg total) by mouth every 8 (eight) hours as needed. 04/08/19   Kallie Locks, FNP  predniSONE (DELTASONE) 50 MG tablet Take 1 tablet (50 mg total) by mouth daily. 07/23/20   Linwood Dibbles, MD  Vitamin D, Ergocalciferol, (DRISDOL) 1.25 MG (50000 UT) CAPS capsule Take 1 capsule (50,000 Units total) by mouth every 7 (seven) days. 04/09/19   Kallie Locks, FNP    Allergies    Patient has no known allergies.  Review of Systems   Review of Systems  All other systems reviewed and are negative.   Physical Exam Updated Vital Signs BP (!) 169/113 (BP Location: Right Arm)   Pulse 93   Temp 98.2 F (36.8 C) (Oral)   Resp 16  LMP 07/15/2020   SpO2 99%   Physical Exam Vitals and nursing note reviewed.  Constitutional:      General: She is not in acute distress.    Appearance: She is well-developed.  HENT:     Head: Normocephalic and atraumatic.     Right Ear: External ear normal.     Left Ear: External ear normal.  Eyes:     General: No scleral icterus.       Right eye: No discharge.        Left eye: No discharge.     Conjunctiva/sclera: Conjunctivae normal.  Neck:     Trachea: No tracheal deviation.  Cardiovascular:     Rate and Rhythm: Normal rate.  Pulmonary:     Effort: Pulmonary effort is normal. No respiratory distress.     Breath sounds: No stridor.  Abdominal:     General: There is no distension.  Musculoskeletal:        General: No swelling or deformity.     Cervical back: Neck  supple.     Lumbar back: Tenderness present. No edema.     Comments: Normal strength and sensation bilateral lower extremities  Skin:    General: Skin is warm and dry.     Findings: No rash.  Neurological:     Mental Status: She is alert.     Cranial Nerves: Cranial nerve deficit: no gross deficits.     ED Results / Procedures / Treatments   Labs (all labs ordered are listed, but only abnormal results are displayed) Labs Reviewed  POC URINE PREG, ED    EKG None  Radiology DG Lumbar Spine Complete  Result Date: 07/23/2020 CLINICAL DATA:  Back pain EXAM: LUMBAR SPINE - COMPLETE 4+ VIEW COMPARISON:  None. FINDINGS: Mild straightening of the lumbar spine. Vertebral body heights are maintained. Minimal disc space narrowing at L5-S1. IMPRESSION: Minimal disc space narrowing at L5-S1. Electronically Signed   By: Jasmine Pang M.D.   On: 07/23/2020 17:16    Procedures Procedures (including critical care time)  Medications Ordered in ED Medications - No data to display  ED Course  I have reviewed the triage vital signs and the nursing notes.  Pertinent labs & imaging results that were available during my care of the patient were reviewed by me and considered in my medical decision making (see chart for details).  Clinical Course as of Jul 23 1741  Fri Jul 23, 2020  1735 X-rays show minimal to space narrowing L5-S1   [JK]    Clinical Course User Index [JK] Linwood Dibbles, MD   MDM Rules/Calculators/A&P                          Patient symptoms are consistent with sciatica.  No acute neurologic dysfunction.  X-rays do show findings that would correlate with possible L5-S1 disc herniation.  We will start her on a course of hydrocodone.  Patient is already taking ibuprofen.  We will add on an additional course of steroids.  Follow-up with your primary care doctor to discuss further evaluation of the symptoms persist. Final Clinical Impression(s) / ED Diagnoses Final diagnoses:    Sciatica of left side    Rx / DC Orders ED Discharge Orders         Ordered    HYDROcodone-acetaminophen (NORCO/VICODIN) 5-325 MG tablet  Every 6 hours PRN        07/23/20 1740    predniSONE (DELTASONE) 50 MG  tablet  Daily        07/23/20 1740           Linwood Dibbles, MD 07/23/20 1742

## 2020-07-23 NOTE — ED Notes (Signed)
Urine specimen sent to lab after POC urine.

## 2022-07-28 IMAGING — CR DG LUMBAR SPINE COMPLETE 4+V
5 series · 5 of 5 positions shown · non-contrast
Comparison: None.

CLINICAL DATA: Back pain

EXAM:
LUMBAR SPINE - COMPLETE 4+ VIEW

[t lumbar spine ap]
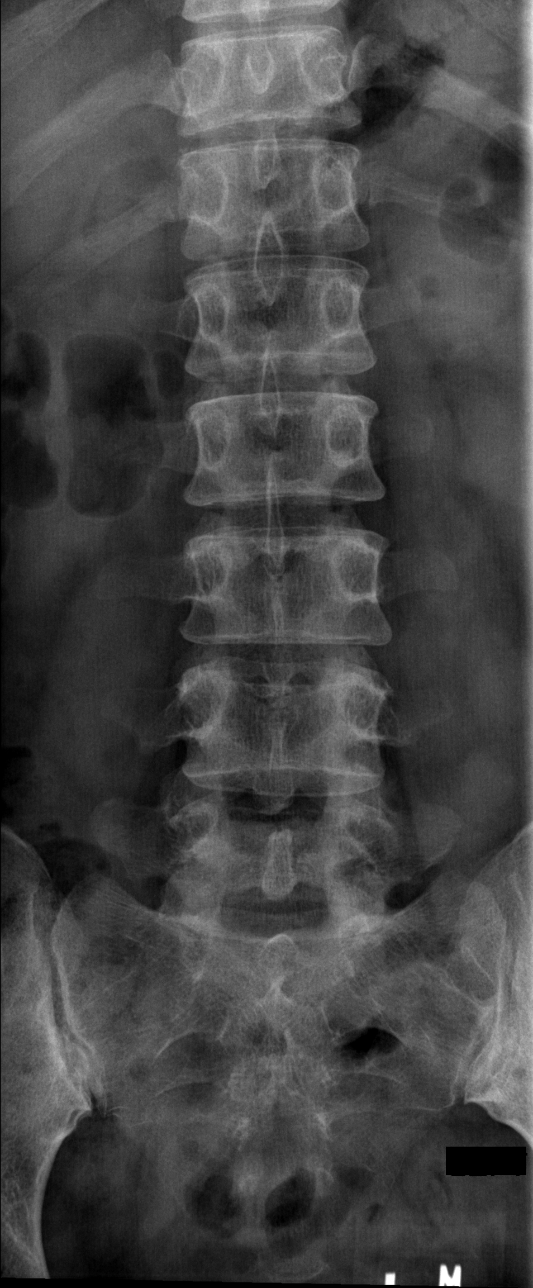

[t lumbar spine obl (1 of 2)]
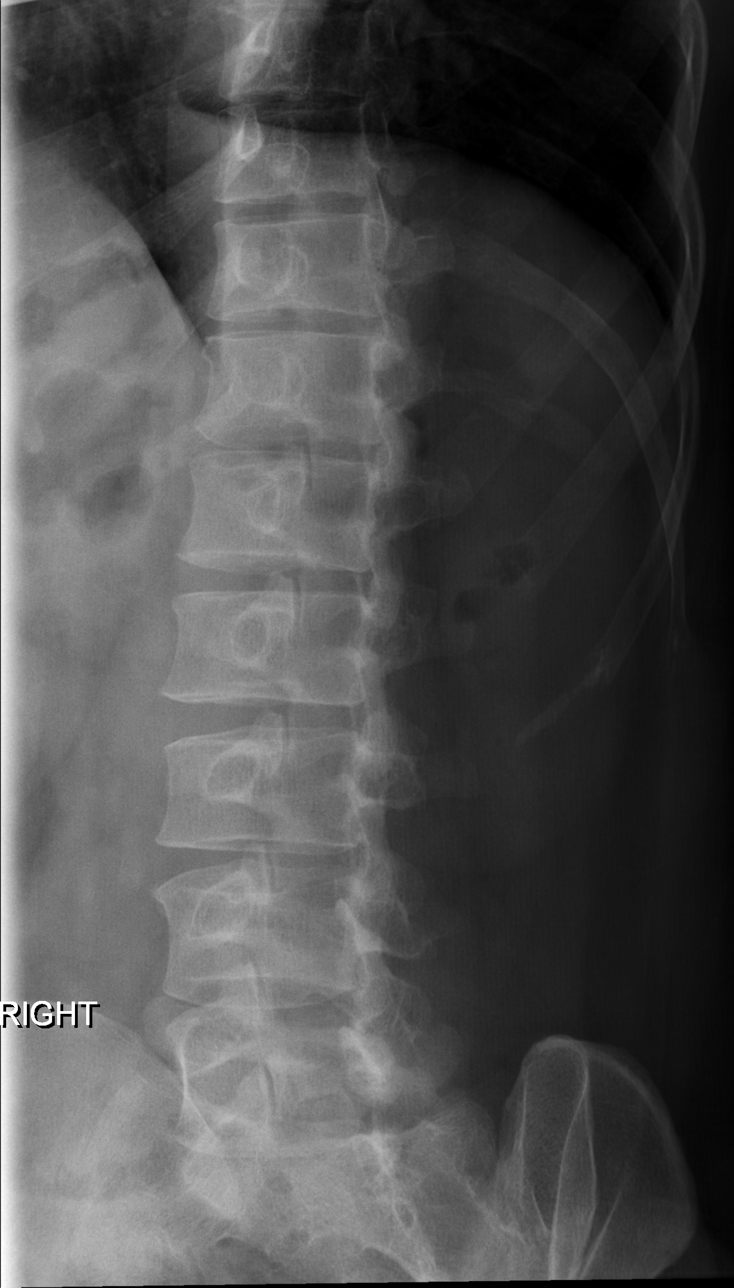

[t lumbar spine obl (2 of 2)]
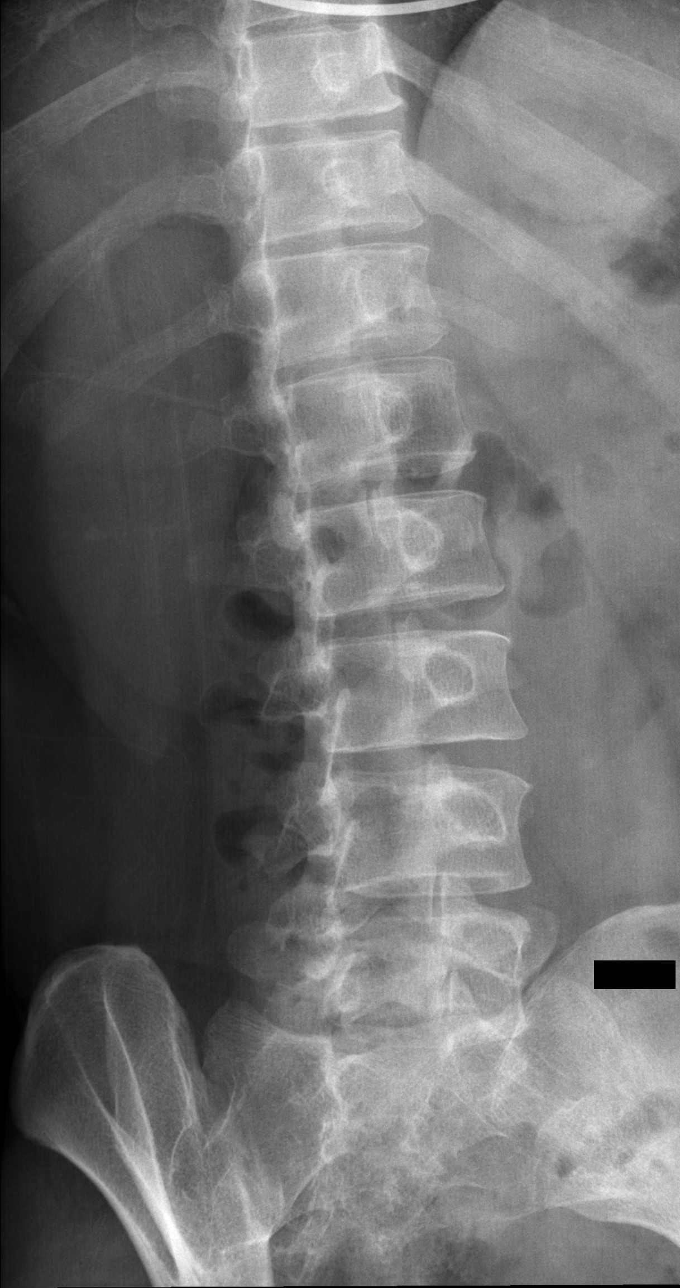

[t lumbar spine lat]
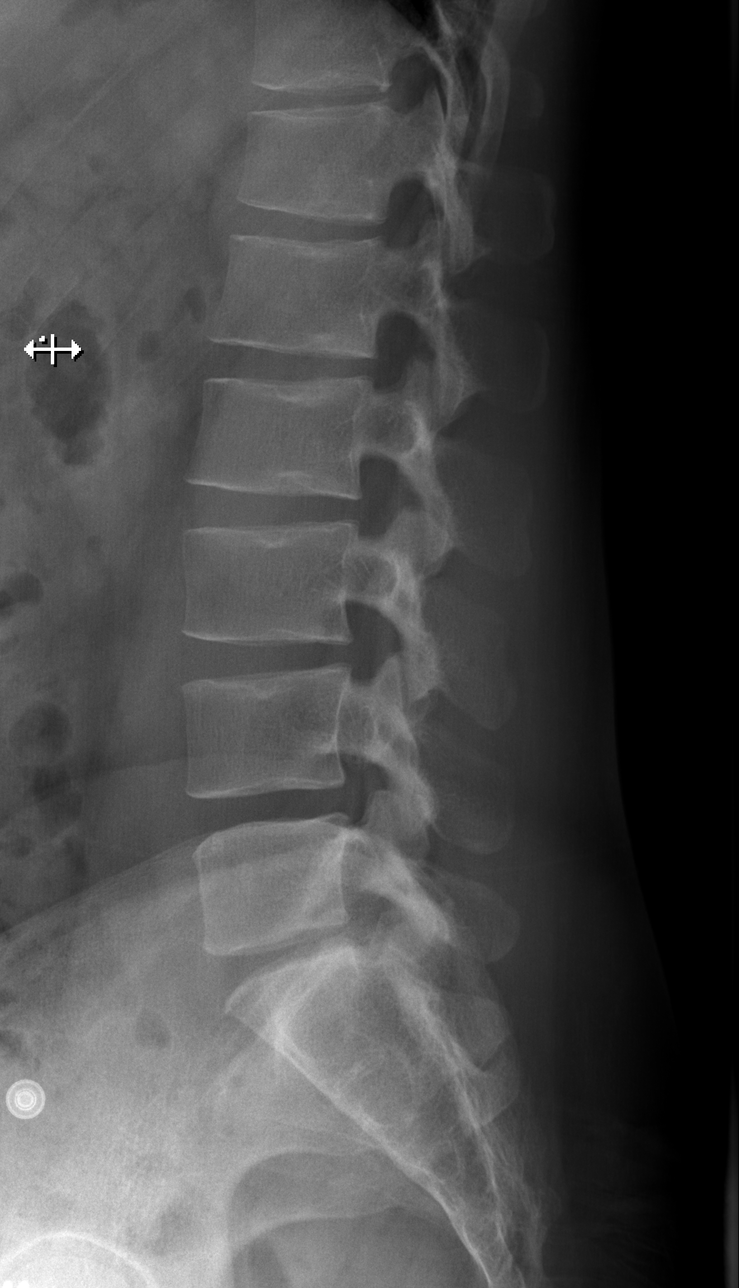

[t lumbar l-5 s-1 spot]
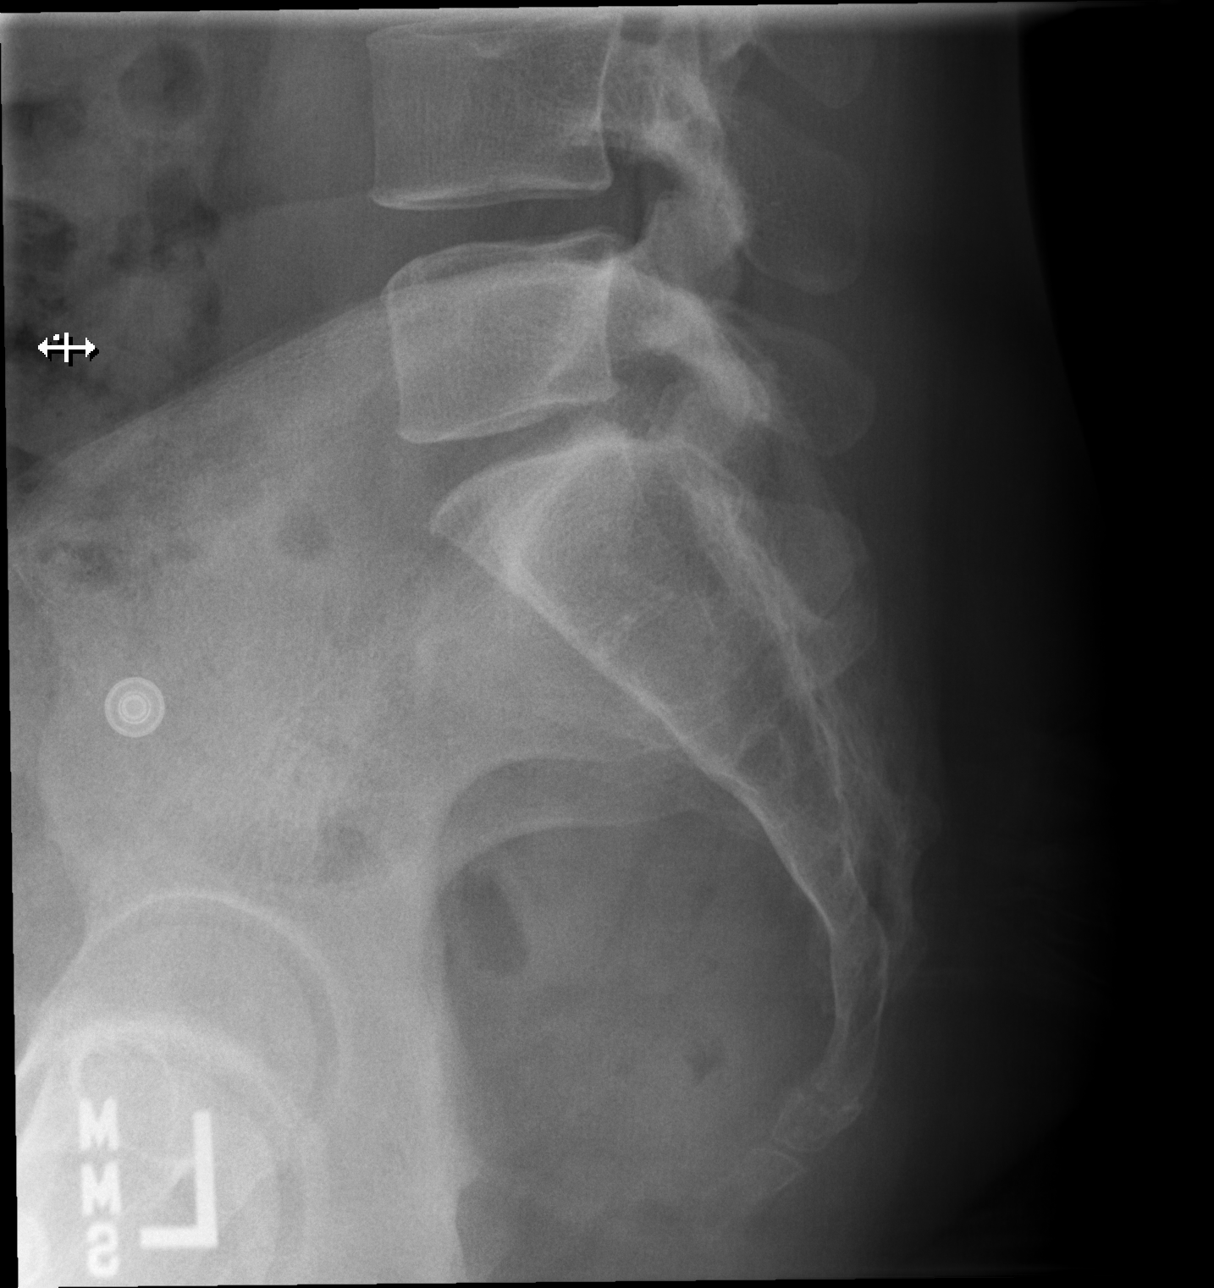

[5 of 5 positions shown; findings below may reference images not displayed]

FINDINGS: Mild straightening of the lumbar spine. Vertebral body heights are
maintained. Minimal disc space narrowing at L5-S1.
IMPRESSION: Minimal disc space narrowing at L5-S1.

## 2024-05-31 ENCOUNTER — Emergency Department (HOSPITAL_COMMUNITY): Payer: Self-pay

## 2024-05-31 ENCOUNTER — Emergency Department (HOSPITAL_COMMUNITY)
Admission: EM | Admit: 2024-05-31 | Discharge: 2024-05-31 | Disposition: A | Discharge status: Home / Self Care | Payer: Self-pay | Attending: Emergency Medicine | Admitting: Emergency Medicine

## 2024-05-31 DIAGNOSIS — R519 Headache, unspecified: Secondary | ICD-10-CM | POA: Insufficient documentation

## 2024-05-31 DIAGNOSIS — R0789 Other chest pain: Secondary | ICD-10-CM | POA: Insufficient documentation

## 2024-05-31 DIAGNOSIS — M25572 Pain in left ankle and joints of left foot: Secondary | ICD-10-CM | POA: Insufficient documentation

## 2024-05-31 DIAGNOSIS — R52 Pain, unspecified: Secondary | ICD-10-CM

## 2024-05-31 DIAGNOSIS — M542 Cervicalgia: Secondary | ICD-10-CM | POA: Insufficient documentation

## 2024-05-31 DIAGNOSIS — Y904 Blood alcohol level of 80-99 mg/100 ml: Secondary | ICD-10-CM | POA: Insufficient documentation

## 2024-05-31 LAB — RAPID URINE DRUG SCREEN, HOSP PERFORMED
Amphetamines: NOT DETECTED
Barbiturates: NOT DETECTED
Benzodiazepines: NOT DETECTED
Cocaine: NOT DETECTED
Opiates: NOT DETECTED
Tetrahydrocannabinol: NOT DETECTED

## 2024-05-31 LAB — COMPREHENSIVE METABOLIC PANEL WITH GFR
ALT: 21 U/L (ref 0–44)
AST: 24 U/L (ref 15–41)
Albumin: 4 g/dL (ref 3.5–5.0)
Alkaline Phosphatase: 57 U/L (ref 38–126)
Anion gap: 11 (ref 5–15)
BUN: 7 mg/dL (ref 6–20)
CO2: 18 mmol/L — ABNORMAL LOW (ref 22–32)
Calcium: 8.8 mg/dL — ABNORMAL LOW (ref 8.9–10.3)
Chloride: 110 mmol/L (ref 98–111)
Creatinine, Ser: 0.59 mg/dL (ref 0.44–1.00)
GFR, Estimated: >60 mL/min (ref >60)
Glucose, Bld: 118 mg/dL — ABNORMAL HIGH (ref 70–99)
Potassium: 3.5 mmol/L (ref 3.5–5.1)
Sodium: 139 mmol/L (ref 135–145)
Total Bilirubin: 0.2 mg/dL (ref 0.0–1.2)
Total Protein: 7.7 g/dL (ref 6.5–8.1)

## 2024-05-31 LAB — URINALYSIS, ROUTINE W REFLEX MICROSCOPIC
Bacteria, UA: NONE SEEN
Bilirubin Urine: NEGATIVE
Glucose, UA: NEGATIVE mg/dL
Ketones, ur: NEGATIVE mg/dL
Leukocytes,Ua: NEGATIVE
Nitrite: NEGATIVE
Protein, ur: 30 mg/dL — AB
Specific Gravity, Urine: 1.008 (ref 1.005–1.030)
pH: 5 (ref 5.0–8.0)

## 2024-05-31 LAB — HCG, SERUM, QUALITATIVE: Preg, Serum: NEGATIVE

## 2024-05-31 LAB — CBC
HCT: 34.9 % — ABNORMAL LOW (ref 36.0–46.0)
Hemoglobin: 11 g/dL — ABNORMAL LOW (ref 12.0–15.0)
MCH: 26.6 pg (ref 26.0–34.0)
MCHC: 31.5 g/dL (ref 30.0–36.0)
MCV: 84.3 fL (ref 80.0–100.0)
Platelets: 314 10*3/uL (ref 150–400)
RBC: 4.14 MIL/uL (ref 3.87–5.11)
RDW: 15.1 % (ref 11.5–15.5)
WBC: 11.7 10*3/uL — ABNORMAL HIGH (ref 4.0–10.5)
nRBC: 0 % (ref 0.0–0.2)

## 2024-05-31 LAB — ETHANOL: Alcohol, Ethyl (B): 98 mg/dL — ABNORMAL HIGH (ref <15)

## 2024-05-31 MED ORDER — IOHEXOL 350 MG/ML SOLN
75.0000 mL | Freq: Once | INTRAVENOUS | Status: AC | PRN
  Administered 2024-05-31: 75 mL via INTRAVENOUS

## 2024-05-31 MED ORDER — MORPHINE SULFATE (PF) 4 MG/ML IV SOLN
4.0000 mg | Freq: Once | INTRAVENOUS | Status: AC
  Administered 2024-05-31: 4 mg via INTRAVENOUS
  Filled 2024-05-31: qty 1

## 2024-05-31 MED ORDER — SODIUM CHLORIDE 0.9 % IV BOLUS (SEPSIS)
1000.0000 mL | Freq: Once | INTRAVENOUS | Status: AC
  Administered 2024-05-31: 1000 mL via INTRAVENOUS

## 2024-05-31 NOTE — ED Notes (Signed)
 Patient transported to CT by trauma RN
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
## 2024-05-31 NOTE — ED Notes (Signed)
 Patient taken to CT by trauma RN.

## 2024-05-31 NOTE — ED Notes (Signed)
 This nurse called CCMD to have patient monitored.

## 2024-05-31 NOTE — ED Notes (Signed)
Patient has returned to the room.

## 2024-05-31 NOTE — ED Notes (Signed)
 [facility] labs sent to main, dark green tube on rocker in mini lab

## 2024-05-31 NOTE — ED Notes (Signed)
 CT head and neck completed, CAP pending return of labs.

## 2024-05-31 NOTE — Discharge Instructions (Addendum)
 Today's evaluation has been reassuring.  Return here for concerning changes otherwise follow-up with your physician.
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
